# Patient Record
Sex: Male | Born: 1995 | Race: Black or African American | Hispanic: No | Marital: Single | Smoking: Current every day smoker
Health system: Southern US, Community
[De-identification: ages and names within clinical notes are randomized; demographics above are authoritative.]

---

## 2016-09-19 ENCOUNTER — Emergency Department
Admission: EM | Admit: 2016-09-19 | Discharge: 2016-09-19 | Disposition: A | Discharge status: Home / Self Care | Payer: BC Managed Care – PPO | Attending: Student | Admitting: Student

## 2016-09-19 ENCOUNTER — Emergency Department: Payer: BC Managed Care – PPO

## 2016-09-19 ENCOUNTER — Encounter: Payer: Self-pay | Admitting: Emergency Medicine

## 2016-09-19 DIAGNOSIS — F17200 Nicotine dependence, unspecified, uncomplicated: Secondary | ICD-10-CM | POA: Insufficient documentation

## 2016-09-19 DIAGNOSIS — T148XXA Other injury of unspecified body region, initial encounter: Secondary | ICD-10-CM

## 2016-09-19 DIAGNOSIS — S92022A Displaced fracture of anterior process of left calcaneus, initial encounter for closed fracture: Secondary | ICD-10-CM | POA: Diagnosis not present

## 2016-09-19 DIAGNOSIS — W108XXA Fall (on) (from) other stairs and steps, initial encounter: Secondary | ICD-10-CM | POA: Insufficient documentation

## 2016-09-19 DIAGNOSIS — Y939 Activity, unspecified: Secondary | ICD-10-CM | POA: Insufficient documentation

## 2016-09-19 DIAGNOSIS — Y9289 Other specified places as the place of occurrence of the external cause: Secondary | ICD-10-CM | POA: Diagnosis not present

## 2016-09-19 DIAGNOSIS — M79672 Pain in left foot: Secondary | ICD-10-CM | POA: Diagnosis present

## 2016-09-19 DIAGNOSIS — S92002A Unspecified fracture of left calcaneus, initial encounter for closed fracture: Secondary | ICD-10-CM

## 2016-09-19 DIAGNOSIS — Y999 Unspecified external cause status: Secondary | ICD-10-CM | POA: Insufficient documentation

## 2016-09-19 MED ORDER — OXYCODONE-ACETAMINOPHEN 5-325 MG PO TABS: 1.0000 | ORAL_TABLET | ORAL | 0 refills | Status: DC | PRN

## 2016-09-19 MED ORDER — OXYCODONE-ACETAMINOPHEN 5-325 MG PO TABS
1.0000 | ORAL_TABLET | Freq: Once | ORAL | Status: AC
  Administered 2016-09-19: 1 via ORAL
  Filled 2016-09-19: qty 1

## 2016-09-19 MED ORDER — HYDROMORPHONE HCL 1 MG/ML IJ SOLN
0.5000 mg | Freq: Once | INTRAMUSCULAR | Status: AC
  Administered 2016-09-19: 0.5 mg via INTRAMUSCULAR
  Filled 2016-09-19: qty 1

## 2016-09-19 NOTE — ED Triage Notes (Signed)
Pt presents to ED c/o L foot pain secondary to fall last night around 2230. Pt states his foot went through a step and caught on ledge causing fall. Swelling noted to top of foot. Pulses present.

## 2016-09-19 NOTE — ED Notes (Signed)
See triage note  States he caught left foot on step and bent foot back  Positive swelling noted across top of foot   Unable to bear full wt

## 2016-09-19 NOTE — ED Provider Notes (Signed)
[facility] Emergency Department Provider Note   ____________________________________________   None    (approximate)  I have reviewed the triage vital signs and the nursing notes.   HISTORY  Chief Complaint Foot Pain    HPI Jeffery Bradley is a 20 y.o. male since for evaluation of left foot pain. Patient states he fell down the steps last night twisting his foot in the process. No relief with over-the-counter medication. Complaining of increased swollen painful   History reviewed. No pertinent past medical history.  There are no active problems to display for this patient.   History reviewed. No pertinent surgical history.  Prior to Admission medications   Medication Sig Start Date End Date Taking? Authorizing Provider  oxyCODONE-acetaminophen (ROXICET) 5-325 MG tablet Take 1-2 tablets by mouth every 4 (four) hours as needed for severe pain. 09/19/16   Evangeline Dakinharles M Beers, PA-C    Allergies Penicillins  History reviewed. No pertinent family history.  Social History Social History  Substance Use Topics  . Smoking status: Current Every Day Smoker  . Smokeless tobacco: Never Used  . Alcohol use Yes     Comment: couple drinks on weekend    Review of Systems Constitutional: No fever/chills Eyes: No visual changes. ENT: No sore throat. Cardiovascular: Denies chest pain. Respiratory: Denies shortness of breath. Musculoskeletal: Positive for left foot pain. Skin: Negative for rash. Neurological: Negative for headaches, focal weakness or numbness.  10-point ROS otherwise negative.  ____________________________________________   PHYSICAL EXAM:  VITAL SIGNS: ED Triage Vitals  Enc Vitals Group     BP 09/19/16 1158 (!) 149/80     Pulse Rate 09/19/16 1158 (!) 105     Resp 09/19/16 1158 20     Temp 09/19/16 1158 99 F (37.2 C)     Temp Source 09/19/16 1158 Oral     SpO2 09/19/16 1158 98 %     Weight 09/19/16 1159 225 lb (102.1 kg)   Height 09/19/16 1159 5\' 11"  (1.803 m)     Head Circumference --      Peak Flow --      Pain Score 09/19/16 1159 7     Pain Loc --      Pain Edu? --      Excl. in GC? --     Constitutional: Alert and oriented. Well appearing and in no acute distress. Cardiovascular: Normal rate, regular rhythm. Grossly normal heart sounds.  Good peripheral circulation. Respiratory: Normal respiratory effort.  No retractions. Lungs CTAB. Musculoskeletal: Left foot with obvious edema. Good capillary refill. Distally neurovascularly intact. Point tenderness noted throughout the foot. Neurologic:  Normal speech and language. No gross focal neurologic deficits are appreciated. No gait instability. Skin:  Skin is warm, dry and intact. No rash noted. Psychiatric: Mood and affect are normal. Speech and behavior are normal.  ____________________________________________   LABS (all labs ordered are listed, but only abnormal results are displayed)  Labs Reviewed - No data to display ____________________________________________  EKG   ____________________________________________  RADIOLOGY  IMPRESSION:  Probable nondisplaced or slightly displaced fracture within the  anterior calcaneus. If this corresponds to the area of patient's  symptoms, would consider confirmation with CT.   IMPRESSION:  1. CT confirms the presence of multiple small avulsion fractures  involving the anterior aspect of calcaneus and the dorsal aspects of  the talar head and navicular.  2. The subtalar and tibiotalar joints appear unremarkable. No  significant joint effusion.  3. Subcutaneous edema without evidence of tendon  injury.    ____________________________________________   PROCEDURES  Procedure(s) performed: None  Procedures  Critical Care performed: No  ____________________________________________   INITIAL IMPRESSION / ASSESSMENT AND PLAN / ED COURSE  Pertinent labs & imaging results that were available  during my care of the patient were reviewed by me and considered in my medical decision making (see chart for details).  Acute calcaneal fracture. Patient follow-up with podiatry next week. Patient placed in an shortl splint with crutches. Rx given for Percocet 5/325.  Clinical Course     ____________________________________________   FINAL CLINICAL IMPRESSION(S) / ED DIAGNOSES  Final diagnoses:  Calcaneal fracture, left, closed, initial encounter      NEW MEDICATIONS STARTED DURING THIS VISIT:  New Prescriptions   OXYCODONE-ACETAMINOPHEN (ROXICET) 5-325 MG TABLET    Take 1-2 tablets by mouth every 4 (four) hours as needed for severe pain.     Note:  This document was prepared using Dragon voice recognition software and may include unintentional dictation errors.   Evangeline Dakin, PA-C 09/19/16 1449    Gayla Doss, MD 09/19/16 [phone]

## 2016-09-19 NOTE — ED Notes (Signed)
Pt verbalized understanding of discharge instructions. NAD at this time. 

## 2016-10-26 ENCOUNTER — Emergency Department
Admission: EM | Admit: 2016-10-26 | Discharge: 2016-10-26 | Disposition: A | Discharge status: Home / Self Care | Payer: BC Managed Care – PPO | Attending: Emergency Medicine | Admitting: Emergency Medicine

## 2016-10-26 ENCOUNTER — Encounter: Payer: Self-pay | Admitting: Emergency Medicine

## 2016-10-26 DIAGNOSIS — F1290 Cannabis use, unspecified, uncomplicated: Secondary | ICD-10-CM | POA: Insufficient documentation

## 2016-10-26 DIAGNOSIS — F17200 Nicotine dependence, unspecified, uncomplicated: Secondary | ICD-10-CM | POA: Diagnosis not present

## 2016-10-26 DIAGNOSIS — J039 Acute tonsillitis, unspecified: Secondary | ICD-10-CM | POA: Diagnosis not present

## 2016-10-26 DIAGNOSIS — J029 Acute pharyngitis, unspecified: Secondary | ICD-10-CM | POA: Diagnosis present

## 2016-10-26 DIAGNOSIS — Z79899 Other long term (current) drug therapy: Secondary | ICD-10-CM | POA: Insufficient documentation

## 2016-10-26 LAB — POCT RAPID STREP A: Streptococcus, Group A Screen (Direct): NEGATIVE

## 2016-10-26 MED ORDER — MAGIC MOUTHWASH: 5.0000 mL | Freq: Three times a day (TID) | ORAL | 0 refills | Status: DC | PRN

## 2016-10-26 MED ORDER — CLARITHROMYCIN 500 MG PO TABS: 500.0000 mg | ORAL_TABLET | Freq: Two times a day (BID) | ORAL | 0 refills | Status: DC

## 2016-10-26 MED ORDER — CLARITHROMYCIN 500 MG PO TABS
500.0000 mg | ORAL_TABLET | Freq: Once | ORAL | Status: AC
  Administered 2016-10-26: 500 mg via ORAL
  Filled 2016-10-26: qty 1

## 2016-10-26 MED ORDER — MAGIC MOUTHWASH
10.0000 mL | Freq: Once | ORAL | Status: AC
  Administered 2016-10-26: 10 mL via ORAL
  Filled 2016-10-26: qty 10

## 2016-10-26 NOTE — ED Triage Notes (Signed)
Pt states that he has had a sore throat since Saturday but was woken up in pain this AM crying. Pt states that his roommate has some kind of illness with N/V. Pt denies N/V/D at this time. Pt is ambulatory with NAD at this time.

## 2016-10-26 NOTE — ED Provider Notes (Signed)
[facility] Emergency Department Provider Note   ____________________________________________   First MD Initiated Contact with Patient 10/26/16 [phone]     (approximate)  I have reviewed the triage vital signs and the nursing notes.   HISTORY  Chief Complaint Sore Throat    HPI Nat Mathathan Choi is a 20 y.o. male who presents to the ED from home with a chief complain of sore throat. Patient reports onset of sore throat 2 days ago which has worsened over the weekend. Denies associated fever, chills, cough, congestion, abdominal pain, nausea, vomiting, rash. Denies recent travel trauma. Nothing makes his symptoms better or worse.  Past medical history None  There are no active problems to display for this patient.   History reviewed. No pertinent surgical history.  Prior to Admission medications   Medication Sig Start Date End Date Taking? Authorizing Provider  clarithromycin (BIAXIN) 500 MG tablet Take 1 tablet (500 mg total) by mouth 2 (two) times daily. 10/26/16   Irean HongJade J Sung, MD  magic mouthwash SOLN Take 5 mLs by mouth 3 (three) times daily as needed for mouth pain. 10/26/16   Irean HongJade J Sung, MD  oxyCODONE-acetaminophen (ROXICET) 5-325 MG tablet Take 1-2 tablets by mouth every 4 (four) hours as needed for severe pain. 09/19/16   Evangeline Dakinharles M Beers, PA-C    Allergies Penicillins  No family history on file.  Social History Social History  Substance Use Topics  . Smoking status: Current Every Day Smoker    Packs/day: 0.25  . Smokeless tobacco: Never Used  . Alcohol use Yes     Comment: couple drinks on weekend    Review of Systems  Constitutional: No fever/chills. Eyes: No visual changes. ENT: Positive for sore throat. Cardiovascular: Denies chest pain. Respiratory: Denies shortness of breath. Gastrointestinal: No abdominal pain.  No nausea, no vomiting.  No diarrhea.  No constipation. Genitourinary: Negative for dysuria. Musculoskeletal:  Negative for back pain. Skin: Negative for rash. Neurological: Negative for headaches, focal weakness or numbness.  10-point ROS otherwise negative.  ____________________________________________   PHYSICAL EXAM:  VITAL SIGNS: ED Triage Vitals  Enc Vitals Group     BP 10/26/16 0528 (!) 144/85     Pulse Rate 10/26/16 0528 82     Resp 10/26/16 0528 18     Temp 10/26/16 0528 98.1 F (36.7 C)     Temp Source 10/26/16 0528 Oral     SpO2 10/26/16 0528 98 %     Weight 10/26/16 0529 235 lb (106.6 kg)     Height 10/26/16 0529 5\' 11"  (1.803 m)     Head Circumference --      Peak Flow --      Pain Score 10/26/16 0529 5     Pain Loc --      Pain Edu? --      Excl. in GC? --     Constitutional: Alert and oriented. Well appearing and in no acute distress. Eyes: Conjunctivae are normal. PERRL. EOMI. Head: Atraumatic. Nose: No congestion/rhinnorhea. Mouth/Throat: Mucous membranes are moist.  Oropharynx moderately erythematous with tonsillar exudates. No tonsillar swelling or peritonsillar abscess. There is no hoarse or muffled voice. There is no drooling. Neck: No stridor.  Supple neck without meningismus. Hematological/Lymphatic/Immunilogical: No cervical lymphadenopathy. Cardiovascular: Normal rate, regular rhythm. Grossly normal heart sounds.  Good peripheral circulation. Respiratory: Normal respiratory effort.  No retractions. Lungs CTAB. Gastrointestinal: Soft and nontender. No distention. No abdominal bruits. No CVA tenderness. Musculoskeletal: No lower extremity tenderness nor edema.  No joint effusions. Neurologic:  Normal speech and language. No gross focal neurologic deficits are appreciated. No gait instability. Skin:  Skin is warm, dry and intact. No rash noted. Psychiatric: Mood and affect are normal. Speech and behavior are normal.  ____________________________________________   LABS (all labs ordered are listed, but only abnormal results are displayed)  Labs Reviewed    POCT RAPID STREP A   ____________________________________________  EKG  none ____________________________________________  RADIOLOGY  none ____________________________________________   PROCEDURES  Procedure(s) performed: None  Procedures  Critical Care performed: No  ____________________________________________   INITIAL IMPRESSION / ASSESSMENT AND PLAN / ED COURSE  Pertinent labs & imaging results that were available during my care of the patient were reviewed by me and considered in my medical decision making (see chart for details).  20 year old male presenting with sore throat. Clinical exam consistent with tonsillitis. Will initiate antibiotic treatment with Biaxin, Magic mouthwash and patient will follow-up with his PCP next week. Strict return precautions given. Patient verbalizes understanding and agrees with plan of care.  Clinical Course      ____________________________________________   FINAL CLINICAL IMPRESSION(S) / ED DIAGNOSES  Final diagnoses:  Tonsillitis  Sore throat      NEW MEDICATIONS STARTED DURING THIS VISIT:  New Prescriptions   CLARITHROMYCIN (BIAXIN) 500 MG TABLET    Take 1 tablet (500 mg total) by mouth 2 (two) times daily.   MAGIC MOUTHWASH SOLN    Take 5 mLs by mouth 3 (three) times daily as needed for mouth pain.     Note:  This document was prepared using Dragon voice recognition software and may include unintentional dictation errors.    Irean HongJade J Sung, MD 10/26/16 [phone]

## 2016-10-26 NOTE — Discharge Instructions (Signed)
1. Take antibiotic as prescribed (Biaxin 500 mg twice daily 7 days). 2. You may take Magic mouthwash as needed for throat pain. 3. Return to the ER for worsening symptoms, persistent vomiting, difficulty breathing or other concerns.

## 2016-10-28 ENCOUNTER — Telehealth: Payer: Self-pay | Admitting: Emergency Medicine

## 2016-10-28 NOTE — Telephone Encounter (Signed)
Patient left message asking if his diagnosis was contagious.  I called him back,  Left message with my number.

## 2020-02-05 ENCOUNTER — Emergency Department
Admission: EM | Admit: 2020-02-05 | Discharge: 2020-02-05 | Disposition: A | Discharge status: Home / Self Care | Payer: Self-pay | Attending: Student | Admitting: Student

## 2020-02-05 ENCOUNTER — Other Ambulatory Visit: Payer: Self-pay

## 2020-02-05 ENCOUNTER — Emergency Department: Payer: Self-pay

## 2020-02-05 ENCOUNTER — Encounter: Payer: Self-pay | Admitting: Emergency Medicine

## 2020-02-05 DIAGNOSIS — F17210 Nicotine dependence, cigarettes, uncomplicated: Secondary | ICD-10-CM | POA: Insufficient documentation

## 2020-02-05 DIAGNOSIS — J0391 Acute recurrent tonsillitis, unspecified: Secondary | ICD-10-CM | POA: Insufficient documentation

## 2020-02-05 LAB — GROUP A STREP BY PCR: Group A Strep by PCR: NOT DETECTED

## 2020-02-05 MED ORDER — METHYLPREDNISOLONE SODIUM SUCC 125 MG IJ SOLR
125.0000 mg | Freq: Once | INTRAMUSCULAR | Status: AC
Start: 2020-02-05 — End: 2020-02-05
  Administered 2020-02-05: 125 mg via INTRAMUSCULAR
  Filled 2020-02-05: qty 2

## 2020-02-05 MED ORDER — ALUM & MAG HYDROXIDE-SIMETH 200-200-20 MG/5ML PO SUSP
30.0000 mL | Freq: Once | ORAL | Status: AC
  Administered 2020-02-05: 30 mL via ORAL
  Filled 2020-02-05: qty 30

## 2020-02-05 MED ORDER — LIDOCAINE VISCOUS HCL 2 % MT SOLN
15.0000 mL | Freq: Once | OROMUCOSAL | Status: AC
  Administered 2020-02-05: 15 mL via OROMUCOSAL
  Filled 2020-02-05: qty 15

## 2020-02-05 MED ORDER — METHYLPREDNISOLONE 4 MG PO TBPK: ORAL_TABLET | ORAL | 0 refills | Status: AC

## 2020-02-05 MED ORDER — DIPHENHYDRAMINE HCL 12.5 MG/5ML PO ELIX
12.5000 mg | ORAL_SOLUTION | Freq: Once | ORAL | Status: AC
  Administered 2020-02-05: 12.5 mg via ORAL
  Filled 2020-02-05: qty 5

## 2020-02-05 MED ORDER — LIDOCAINE VISCOUS HCL 2 % MT SOLN: 5.0000 mL | Freq: Four times a day (QID) | OROMUCOSAL | 0 refills | Status: AC | PRN

## 2020-02-05 MED ORDER — LIDOCAINE VISCOUS HCL 2 % MT SOLN
15.0000 mL | Freq: Once | OROMUCOSAL | Status: AC
  Administered 2020-02-05: 15 mL via ORAL
  Filled 2020-02-05: qty 15

## 2020-02-05 NOTE — Discharge Instructions (Signed)
Follow discharge care instruction.  Contact the ENT clinic to schedule appointment for evaluation of tonsillectomy.

## 2020-02-05 NOTE — ED Provider Notes (Signed)
[facility] Emergency Department Provider Note   ____________________________________________   First MD Initiated Contact with Patient 02/05/20 1235     (approximate)  I have reviewed the triage vital signs and the nursing notes.   HISTORY  Chief Complaint Sore Throat    HPI Jeffery Bradley is a 24 y.o. male patient complain sore throat for 5 days.  Patient has a history of tonsil-itis.  Patient is a hard to swallow Intal.  Patient states transient relief with Chloraseptic spray.  Patient able to tolerate fluids but cannot tolerate solid foods at this time.  Patient rates pain as a 10/10.  Patient described pain is "sore".         History reviewed. No pertinent past medical history.  There are no problems to display for this patient.   History reviewed. No pertinent surgical history.  Prior to Admission medications   Medication Sig Start Date End Date Taking? Authorizing Provider  lidocaine (XYLOCAINE) 2 % solution Use as directed 5 mLs in the mouth or throat every 6 (six) hours as needed for mouth pain. 02/05/20   Sable Feil, PA-C  methylPREDNISolone (MEDROL DOSEPAK) 4 MG TBPK tablet Take Tapered dose as directed 02/05/20   Sable Feil, PA-C    Allergies Penicillins  No family history on file.  Social History Social History   Tobacco Use  . Smoking status: Current Every Day Smoker    Packs/day: 0.25  . Smokeless tobacco: Never Used  Substance Use Topics  . Alcohol use: Yes    Comment: couple drinks on weekend  . Drug use: Yes    Types: Marijuana    Review of Systems Constitutional: No fever/chills Eyes: No visual changes. ENT: Sore throat. Cardiovascular: Denies chest pain. Respiratory: Denies shortness of breath. Gastrointestinal: No abdominal pain.  No nausea, no vomiting.  No diarrhea.  No constipation. Genitourinary: Negative for dysuria. Musculoskeletal: Negative for back pain. Skin: Negative for  rash. Neurological: Negative for headaches, focal weakness or numbness.   ____________________________________________   PHYSICAL EXAM:  VITAL SIGNS: ED Triage Vitals  Enc Vitals Group     BP 02/05/20 1211 (!) 147/93     Pulse Rate 02/05/20 1211 (!) 103     Resp 02/05/20 1211 20     Temp 02/05/20 1211 99.6 F (37.6 C)     Temp Source 02/05/20 1211 Oral     SpO2 02/05/20 1211 96 %     Weight 02/05/20 1214 260 lb (117.9 kg)     Height 02/05/20 1214 5\' 11"  (1.803 m)     Head Circumference --      Peak Flow --      Pain Score 02/05/20 1212 10     Pain Loc --      Pain Edu? --      Excl. in [place]? --    Constitutional: Alert and oriented. Well appearing and in no acute distress. Mouth/Throat: Mucous membranes are moist.  Oropharynx non-erythematous.  Edematous nonexudative tonsils. Neck: No stridor.  Hematological/Lymphatic/Immunilogical: No cervical lymphadenopathy. Cardiovascular: Normal rate, regular rhythm. Grossly normal heart sounds.  Good peripheral circulation. Respiratory: Normal respiratory effort.  No retractions. Lungs CTAB. Neurologic:  Normal speech and language. No gross focal neurologic deficits are appreciated. No gait instability. Skin:  Skin is warm, dry and intact. No rash noted. Psychiatric: Mood and affect are normal. Speech and behavior are normal.  ____________________________________________   LABS (all labs ordered are listed, but only abnormal results are displayed)  Labs  Reviewed  GROUP A STREP BY PCR   ____________________________________________  EKG   ____________________________________________  RADIOLOGY  ED MD interpretation:    Official radiology report(s): DG Neck Soft Tissue  Result Date: 02/05/2020 CLINICAL DATA:  Throat pain and dysphagia EXAM: NECK SOFT TISSUES - 1+ VIEW COMPARISON:  None. FINDINGS: Frontal and lateral views were obtained. Epiglottis and aryepiglottic folds appear normal. Prevertebral soft tissues are normal.  No air-fluid level to suggest abscess. Soft tissue prominence is present in the lingular tonsil region. Tonsils and adenoids elsewhere appear normal. No bony abnormality. Visualized lungs clear. No tracheal narrowing noted. IMPRESSION: Lingular tonsillar prominence of uncertain etiology. Inflammation could cause this appearance. No other tonsillar enlargement. Epiglottis and aryepiglottic folds appear normal. Study otherwise unremarkable. Electronically Signed   By: Bretta Bang III M.D.   On: 02/05/2020 13:34    ____________________________________________   PROCEDURES  Procedure(s) performed (including Critical Care):  Procedures   ____________________________________________   INITIAL IMPRESSION / ASSESSMENT AND PLAN / ED COURSE  As part of my medical decision making, I reviewed the following data within the electronic MEDICAL RECORD NUMBER     Patient presents with 5 days of sore throat.  Discussed negative strep results with patient.  Discussed soft tissue neck findings with patient.  His exam is consistent with tonsillitis.  Patient given discharge care instruction and supportive care medication.  Patient advised to follow-up with ENT secondary to recurrent tonsillitis.   Jeffery Bradley was evaluated in Emergency Department on 02/05/2020 for the symptoms described in the history of present illness. He was evaluated in the context of the global COVID-19 pandemic, which necessitated consideration that the patient might be at risk for infection with the SARS-CoV-2 virus that causes COVID-19. Institutional protocols and algorithms that pertain to the evaluation of patients at risk for COVID-19 are in a state of rapid change based on information released by regulatory bodies including the CDC and federal and state organizations. These policies and algorithms were followed during the patient's care in the ED.       ____________________________________________   FINAL CLINICAL  IMPRESSION(S) / ED DIAGNOSES  Final diagnoses:  Recurrent tonsillitis     ED Discharge Orders         Ordered    lidocaine (XYLOCAINE) 2 % solution  Every 6 hours PRN     02/05/20 1556    methylPREDNISolone (MEDROL DOSEPAK) 4 MG TBPK tablet     02/05/20 1556           Note:  This document was prepared using Dragon voice recognition software and may include unintentional dictation errors.    Joni Reining, PA-C 02/05/20 1601    Miguel Aschoff., MD 02/05/20 [phone]

## 2020-02-05 NOTE — ED Notes (Signed)
See triage note  Presents with sore throat  States he developed sore throat last Thursday  Low grade fever on  Arrival  Increased pain with swallowing

## 2020-02-05 NOTE — ED Triage Notes (Signed)
Pt presents to ED via POV with c/o sore throat since Thursday night. P tstates hx of Tonsillitis. Pt endorses feeling like hard to talk due to pain. Pt typing on phone for communication. Pt appears reasonably oriented with answers via text message, pt does not speak while in triage. VSS.   Pt states using hall and chloraseptic spray without relief.   Pt appears able to maintain his own secretions in triage. Pt with noted inflammation to tonsils in triage, no white patches visible to this RN, no significant redness noted in triage.

## 2020-04-05 ENCOUNTER — Ambulatory Visit: Payer: Self-pay | Attending: Internal Medicine

## 2020-04-05 DIAGNOSIS — Z23 Encounter for immunization: Secondary | ICD-10-CM

## 2020-04-05 NOTE — Progress Notes (Signed)
   Covid-19 Vaccination Clinic  Name:  Jeffery Bradley    MRN: [id number] DOB: 04/09/1996  04/05/2020  [name] was observed post Covid-19 immunization for 15 minutes without incident. He was provided with Vaccine Information Sheet and instruction to access the V-Safe system.   [name] was instructed to call 911 with any severe reactions post vaccine: Marland Kitchen Difficulty breathing  . Swelling of face and throat  . A fast heartbeat  . A bad rash all over body  . Dizziness and weakness   Immunizations Administered    Name Date Dose VIS Date Route   Pfizer COVID-19 Vaccine 04/05/2020  4:21 PM 0.3 mL 12/01/2019 Intramuscular   Manufacturer: [employer], [employer]   Lot: [id number]   NDC: [address]-0

## 2020-04-29 ENCOUNTER — Ambulatory Visit: Payer: Self-pay | Attending: Internal Medicine

## 2020-04-29 DIAGNOSIS — Z23 Encounter for immunization: Secondary | ICD-10-CM

## 2020-04-29 NOTE — Progress Notes (Signed)
   Covid-19 Vaccination Clinic  Name:  Jeffery Bradley    MRN: [id number] DOB: 02/07/1996  04/29/2020  Mr. Jeffery Bradley was observed post Covid-19 immunization for 15 minutes without incident. He was provided with Vaccine Information Sheet and instruction to access the V-Safe system.   Mr. Jeffery Bradley was instructed to call 911 with any severe reactions post vaccine: Marland Kitchen Difficulty breathing  . Swelling of face and throat  . A fast heartbeat  . A bad rash all over body  . Dizziness and weakness   Immunizations Administered    Name Date Dose VIS Date Route   Pfizer COVID-19 Vaccine 04/29/2020  9:43 AM 0.3 mL 02/14/2019 Intramuscular   Manufacturer: [employer], [employer]   Lot: [id number]   NDC: [address]-8

## 2020-09-05 ENCOUNTER — Ambulatory Visit: Payer: Self-pay | Admitting: Physician Assistant

## 2020-09-05 ENCOUNTER — Other Ambulatory Visit: Payer: Self-pay

## 2020-09-05 ENCOUNTER — Encounter: Payer: Self-pay | Admitting: Physician Assistant

## 2020-09-05 DIAGNOSIS — Z113 Encounter for screening for infections with a predominantly sexual mode of transmission: Secondary | ICD-10-CM

## 2020-09-05 NOTE — Progress Notes (Signed)
   [facility] Department STI clinic/screening visit  Subjective:  Talin Feister is a 24 y.o. male being seen today for an STI screening visit. The patient reports they do not have symptoms.    Patient has the following medical conditions:  There are no problems to display for this patient.    Chief Complaint  Patient presents with  . SEXUALLY TRANSMITTED DISEASE    screening    HPI  Patient reports that she is not having any symptoms but would like a screening today.  States that she needs additional testing for PCP visit at [facility] but is not sure that we can do this here.  Reports last HIV test was 03/2020.  Last void prior to sample collection for GC/Chlamydia was 1.5 hr ago.   See flowsheet for further details and programmatic requirements.    The following portions of the patient's history were reviewed and updated as appropriate: allergies, current medications, past medical history, past social history, past surgical history and problem list.  Objective:  There were no vitals filed for this visit.  Physical Exam Constitutional:      General: He is not in acute distress.    Appearance: Normal appearance.  HENT:     Head: Normocephalic and atraumatic.     Comments: No nits, lice or hair loss. No cervical, supraclavicular or axillary adenopathy.    Mouth/Throat:     Mouth: Mucous membranes are moist.     Pharynx: Oropharynx is clear. No oropharyngeal exudate or posterior oropharyngeal erythema.  Eyes:     Conjunctiva/sclera: Conjunctivae normal.  Pulmonary:     Effort: Pulmonary effort is normal.  Abdominal:     Palpations: Abdomen is soft. There is no mass.     Tenderness: There is no abdominal tenderness. There is no guarding or rebound.  Genitourinary:    Penis: Normal.      Testes: Normal.     Rectum: Normal.     Comments: Pubic area without nits, lice, edema, erythema, lesions and inguinal adenopathy. Penis without rash, lesions and discharge at  meatus. Musculoskeletal:     Cervical back: Neck supple. No tenderness.  Skin:    General: Skin is warm and dry.     Findings: No bruising, erythema, lesion or rash.  Neurological:     Mental Status: He is alert and oriented to person, place, and time.  Psychiatric:        Mood and Affect: Mood normal.        Behavior: Behavior normal.        Thought Content: Thought content normal.        Judgment: Judgment normal.       Assessment and Plan:  Star Cheese is a 24 y.o. male presenting to the [facility] Department for STI screening  1. Screening for STD (sexually transmitted disease) Patient into clinic without symptoms. Rec condoms with all sex. Await test results.  Counseled that RN will call if needs to RTC for treatment once results are back. - Chlamydia/Gonorrhea [place] [facility] - HIV/HCV [place] [facility] - Syphilis Serology, [place] [facility] - Chlamydia/Gonorrhea [place] [facility] - Chlamydia/Gonorrhea [place] [facility]     No follow-ups on file.  No future appointments.  Matt Holmes, PA

## 2020-09-12 LAB — HM HEPATITIS C SCREENING LAB: HM Hepatitis Screen: NEGATIVE

## 2020-09-12 LAB — HM HIV SCREENING LAB: HM HIV Screening: NEGATIVE

## 2020-10-19 ENCOUNTER — Encounter: Payer: Self-pay | Admitting: Emergency Medicine

## 2020-10-19 ENCOUNTER — Other Ambulatory Visit: Payer: Self-pay

## 2020-10-19 DIAGNOSIS — R112 Nausea with vomiting, unspecified: Secondary | ICD-10-CM | POA: Insufficient documentation

## 2020-10-19 DIAGNOSIS — F17210 Nicotine dependence, cigarettes, uncomplicated: Secondary | ICD-10-CM | POA: Insufficient documentation

## 2020-10-19 MED ORDER — ONDANSETRON 4 MG PO TBDP
4.0000 mg | ORAL_TABLET | Freq: Once | ORAL | Status: AC | PRN
  Administered 2020-10-19: 4 mg via ORAL
  Filled 2020-10-19: qty 1

## 2020-10-19 NOTE — ED Triage Notes (Signed)
Pt arrived via POV with c/o chills, fever, nausea and emesis. Pt is able to drink water, c/o poor PO intake as well.   Sxs started yesterday.

## 2020-10-20 ENCOUNTER — Emergency Department
Admission: EM | Admit: 2020-10-20 | Discharge: 2020-10-20 | Disposition: A | Discharge status: Home / Self Care | Payer: Self-pay | Attending: Emergency Medicine | Admitting: Emergency Medicine

## 2020-10-20 DIAGNOSIS — R112 Nausea with vomiting, unspecified: Secondary | ICD-10-CM

## 2020-10-20 LAB — COMPREHENSIVE METABOLIC PANEL
ALT: 22 U/L (ref 0–44)
AST: 29 U/L (ref 15–41)
Albumin: 4.2 g/dL (ref 3.5–5.0)
Alkaline Phosphatase: 64 U/L (ref 38–126)
Anion gap: 13 (ref 5–15)
BUN: 14 mg/dL (ref 6–20)
CO2: 22 mmol/L (ref 22–32)
Calcium: 8.7 mg/dL — ABNORMAL LOW (ref 8.9–10.3)
Chloride: 104 mmol/L (ref 98–111)
Creatinine, Ser: 1.12 mg/dL (ref 0.61–1.24)
GFR, Estimated: >60 mL/min (ref >60)
Glucose, Bld: 127 mg/dL — ABNORMAL HIGH (ref 70–99)
Potassium: 3.6 mmol/L (ref 3.5–5.1)
Sodium: 139 mmol/L (ref 135–145)
Total Bilirubin: 1.3 mg/dL — ABNORMAL HIGH (ref 0.3–1.2)
Total Protein: 7.3 g/dL (ref 6.5–8.1)

## 2020-10-20 LAB — URINALYSIS, COMPLETE (UACMP) WITH MICROSCOPIC
Bilirubin Urine: NEGATIVE
Glucose, UA: NEGATIVE mg/dL
Hgb urine dipstick: NEGATIVE
Ketones, ur: 5 mg/dL — AB
Nitrite: NEGATIVE
Protein, ur: NEGATIVE mg/dL
Specific Gravity, Urine: 1.024 (ref 1.005–1.030)
pH: 8 (ref 5.0–8.0)

## 2020-10-20 LAB — CBC
HCT: 43.1 % (ref 39.0–52.0)
Hemoglobin: 15 g/dL (ref 13.0–17.0)
MCH: 33.5 pg (ref 26.0–34.0)
MCHC: 34.8 g/dL (ref 30.0–36.0)
MCV: 96.2 fL (ref 80.0–100.0)
Platelets: 323 10*3/uL (ref 150–400)
RBC: 4.48 MIL/uL (ref 4.22–5.81)
RDW: 12.7 % (ref 11.5–15.5)
WBC: 8.9 10*3/uL (ref 4.0–10.5)
nRBC: 0 % (ref 0.0–0.2)

## 2020-10-20 LAB — LIPASE, BLOOD: Lipase: 42 U/L (ref 11–51)

## 2020-10-20 MED ORDER — ONDANSETRON 4 MG PO TBDP: 4.0000 mg | ORAL_TABLET | Freq: Three times a day (TID) | ORAL | 0 refills | Status: AC | PRN

## 2020-10-20 MED ORDER — CALCIUM CARBONATE ANTACID 500 MG PO CHEW
1.0000 | CHEWABLE_TABLET | Freq: Once | ORAL | Status: AC
  Administered 2020-10-20: 200 mg via ORAL
  Filled 2020-10-20: qty 1

## 2020-10-20 NOTE — ED Notes (Signed)
Pt states vomiting several times and is green in color. Pt is resting in bed. Pulse ox and bp cuff on pt.

## 2020-10-20 NOTE — ED Notes (Signed)
Pt given a warm blanket 

## 2020-10-20 NOTE — ED Provider Notes (Signed)
[facility] Emergency Department Provider Note   ____________________________________________   First MD Initiated Contact with Patient 10/20/20 [phone]     (approximate)  I have reviewed the triage vital signs and the nursing notes.   HISTORY  Chief Complaint Emesis and Fever    HPI Niv Darley is a 24 y.o. male with no stated past medical history who presents for 24 hours of nausea and vomiting.  Patient denies any abdominal pain.  Patient states that he is able to keep down some water but is unable to keep down any solids since yesterday.  Patient denies any recent sick contacts.  Patient states that he is fully vaccinated for Covid.  Patient states that symptoms have greatly improved after Zofran was ordered in the waiting room.         History reviewed. No pertinent past medical history.  There are no problems to display for this patient.   History reviewed. No pertinent surgical history.  Prior to Admission medications   Medication Sig Start Date End Date Taking? Authorizing Provider  emtricitabine-tenofovir (TRUVADA) 200-300 MG tablet Take 1 tablet by mouth daily.    [provider]  fexofenadine (ALLEGRA) 60 MG tablet Take by mouth.    [provider]  lidocaine (XYLOCAINE) 2 % solution Use as directed 5 mLs in the mouth or throat every 6 (six) hours as needed for mouth pain. 02/05/20   Joni Reining, PA-C  methylPREDNISolone (MEDROL DOSEPAK) 4 MG TBPK tablet Take Tapered dose as directed 02/05/20   Joni Reining, PA-C  ondansetron (ZOFRAN ODT) 4 MG disintegrating tablet Take 1 tablet (4 mg total) by mouth every 8 (eight) hours as needed for nausea or vomiting. 10/20/20   Merwyn Katos, MD    Allergies Penicillins  No family history on file.  Social History Social History   Tobacco Use  . Smoking status: Current Every Day Smoker    Packs/day: 1.00    Types: Cigarettes  . Smokeless tobacco: Never Used  Vaping  Use  . Vaping Use: Never used  Substance Use Topics  . Alcohol use: Yes    Comment: couple drinks on weekend  . Drug use: Yes    Types: Marijuana    Review of Systems Constitutional: No fever/chills Eyes: No visual changes. ENT: No sore throat. Cardiovascular: Denies chest pain. Respiratory: Denies shortness of breath. Gastrointestinal: No abdominal pain.  Endorses nausea, no vomiting.  No diarrhea. Genitourinary: Negative for dysuria. Musculoskeletal: Negative for acute arthralgias Skin: Negative for rash. Neurological: Negative for headaches, weakness/numbness/paresthesias in any extremity Psychiatric: Negative for suicidal ideation/homicidal ideation   ____________________________________________   PHYSICAL EXAM:  VITAL SIGNS: ED Triage Vitals  Enc Vitals Group     BP 10/19/20 2327 (!) 153/102     Pulse Rate 10/19/20 2327 85     Resp 10/19/20 2327 20     Temp 10/19/20 2327 98.2 F (36.8 C)     Temp Source 10/19/20 2327 Oral     SpO2 10/19/20 2327 97 %     Weight 10/19/20 2325 220 lb (99.8 kg)     Height 10/19/20 2325 5\' 11"  (1.803 m)     Head Circumference --      Peak Flow --      Pain Score 10/19/20 2324 8     Pain Loc --      Pain Edu? --      Excl. in GC? --    Constitutional: Alert and oriented. Well appearing  and in no acute distress. Eyes: Conjunctivae are normal. PERRL. Head: Atraumatic. Nose: No congestion/rhinnorhea. Mouth/Throat: Mucous membranes are moist. Neck: No stridor Cardiovascular: Grossly normal heart sounds.  Good peripheral circulation. Respiratory: Normal respiratory effort.  No retractions. Gastrointestinal: Soft and nontender. No distention. Musculoskeletal: No obvious deformities Neurologic:  Normal speech and language. No gross focal neurologic deficits are appreciated. Skin:  Skin is warm and dry. No rash noted. Psychiatric: Mood and affect are normal. Speech and behavior are  normal.  ____________________________________________   LABS (all labs ordered are listed, but only abnormal results are displayed)  Labs Reviewed  COMPREHENSIVE METABOLIC PANEL - Abnormal; Notable for the following components:      Result Value   Glucose, Bld 127 (*)    Calcium 8.7 (*)    Total Bilirubin 1.3 (*)    All other components within normal limits  URINALYSIS, COMPLETE (UACMP) WITH MICROSCOPIC - Abnormal; Notable for the following components:   Color, Urine YELLOW (*)    APPearance CLEAR (*)    Ketones, ur 5 (*)    Leukocytes,Ua TRACE (*)    Bacteria, UA RARE (*)    All other components within normal limits  LIPASE, BLOOD  CBC     PROCEDURES  Procedure(s) performed (including Critical Care):  .1-3 Lead EKG Interpretation Performed by: Merwyn Katos, MD Authorized by: Merwyn Katos, MD     Interpretation: normal     ECG rate:  89   ECG rate assessment: normal     Rhythm: sinus rhythm     Ectopy: none     Conduction: normal       ____________________________________________   INITIAL IMPRESSION / ASSESSMENT AND PLAN / ED COURSE  As part of my medical decision making, I reviewed the following data within the electronic MEDICAL RECORD NUMBER Nursing notes reviewed and incorporated, Labs reviewed, Old chart reviewed, and Notes from prior ED visits reviewed and incorporated        Patient presents for acute nausea/vomiting The cause of the patients symptoms is not clear, but the patient is overall well appearing and is suspected to have a transient course of illness.  Given History and Exam there does not appear to be an emergent cause of the symptoms such as small bowel obstruction, coronary syndrome, bowel ischemia, DKA, pancreatitis, appendicitis, other acute abdomen or other emergent problem.  Reassessment: After treatment, the patient is feeling much better, tolerating PO fluids, and shows no signs of dehydration.   Disposition: Discharge home  with prompt primary care physician follow up in the next 48 hours. Strict return precautions discussed.      ____________________________________________   FINAL CLINICAL IMPRESSION(S) / ED DIAGNOSES  Final diagnoses:  Non-intractable vomiting with nausea, unspecified vomiting type     ED Discharge Orders         Ordered    ondansetron (ZOFRAN ODT) 4 MG disintegrating tablet  Every 8 hours PRN        10/20/20 0208           Note:  This document was prepared using Dragon voice recognition software and may include unintentional dictation errors.   Merwyn Katos, MD 10/20/20 0400

## 2020-10-20 NOTE — ED Notes (Signed)
Pt given crackers and water for PO challenge

## 2021-06-16 ENCOUNTER — Encounter: Payer: Self-pay | Admitting: Physician Assistant

## 2021-06-16 ENCOUNTER — Other Ambulatory Visit: Payer: Self-pay

## 2021-06-16 ENCOUNTER — Ambulatory Visit: Payer: Self-pay | Admitting: Physician Assistant

## 2021-06-16 DIAGNOSIS — Z113 Encounter for screening for infections with a predominantly sexual mode of transmission: Secondary | ICD-10-CM

## 2021-06-16 NOTE — Progress Notes (Signed)
25 year old male in clinic today for an STI exam. Glenna Fellows, RN

## 2021-06-16 NOTE — Progress Notes (Signed)
[facility] Department STI clinic/screening visit  Subjective:  Jeffery Bradley is a 25 y.o. male being seen today for an STI screening visit. The patient reports they do not have symptoms.    Patient has the following medical conditions:  There are no problems to display for this patient.    Chief Complaint  Patient presents with   SEXUALLY TRANSMITTED DISEASE    screening    HPI  Patient reports that he has had a positive test at [facility] for HIV and is here for a full screening and in particular a HIV test with confirmatory testing.  Denies any symptoms, chronic conditions, surgeries and regular medicines.  Reports that he was previously on PrEP but stopped it a while ago.  Reports last HIV test was earlier this month and last void prior to sample collection for GC/Chlamydia testing was over 2 hr ago.   See flowsheet for further details and programmatic requirements.    The following portions of the patient's history were reviewed and updated as appropriate: allergies, current medications, past medical history, past social history, past surgical history and problem list.  Objective:  There were no vitals filed for this visit.  Physical Exam Constitutional:      General: He is not in acute distress.    Appearance: Normal appearance.  HENT:     Head: Normocephalic and atraumatic.     Comments: No nits,lice, or hair loss. No cervical, supraclavicular or axillary adenopathy.     Mouth/Throat:     Mouth: Mucous membranes are moist.     Pharynx: Oropharynx is clear. No oropharyngeal exudate or posterior oropharyngeal erythema.  Eyes:     Conjunctiva/sclera: Conjunctivae normal.  Pulmonary:     Effort: Pulmonary effort is normal.  Abdominal:     Palpations: Abdomen is soft. There is no mass.     Tenderness: There is no abdominal tenderness. There is no guarding or rebound.  Genitourinary:    Penis: Normal.      Testes: Normal.     Rectum: Normal.      Comments: Pubic area without nits, lice, hair loss, edema, erythema, lesions and inguinal adenopathy. Penis circumcised  without rash, lesions and discharge at meatus. Testicles descended bilaterally,nt, no masses or edema.  Musculoskeletal:     Cervical back: Neck supple. No tenderness.  Skin:    General: Skin is warm and dry.     Findings: No bruising, erythema, lesion or rash.  Neurological:     Mental Status: He is alert and oriented to person, place, and time.  Psychiatric:        Mood and Affect: Mood normal.        Behavior: Behavior normal.        Thought Content: Thought content normal.        Judgment: Judgment normal.      Assessment and Plan:  Jeffery Bradley is a 25 y.o. male presenting to the [facility] Department for STI screening  1. Screening for STD (sexually transmitted disease) Patient into clinic without symptoms. Patient counseled re: [facility] testing for HIV includes 3 types of HIV testing. Counseled that if the testing is positive that he will be interviewed by [facility]. Rec condoms with all sex. Await test results.  Counseled that RN will call if needs to [facility] for treatment once results are back.  - Chlamydia/Gonorrhea [place] [facility] - HBV Antigen/Antibody [facility] - HIV/HCV [place] [facility] - Syphilis Serology, [place] [facility] - Chlamydia/Gonorrhea  [place] [facility] - Chlamydia/Gonorrhea [place] [facility]     No follow-ups on file.  No future appointments.  Matt Holmes, PA

## 2021-06-18 LAB — HEPATITIS B SURFACE ANTIGEN

## 2021-06-18 LAB — HM HEPATITIS C SCREENING LAB: HM Hepatitis Screen: NEGATIVE

## 2021-06-19 LAB — HM HIV SCREENING LAB: HM HIV Screening: POSITIVE

## 2021-07-08 NOTE — Progress Notes (Signed)
Order(s) created erroneously. Erroneous order ID: [id number]  Order moved by: SHEALY, DEBRA D  Order move date/time: 07/08/2021 10:54 AM  Source Patient: [id number]  Source Contact: 06/18/2021  Destination Patient: [id number]  Destination Contact: 08/08/2020 

## 2022-02-11 IMAGING — CR DG NECK SOFT TISSUE
2 series · 2 of 2 positions shown · non-contrast
Comparison: None.

CLINICAL DATA: Throat pain and dysphagia

EXAM:
NECK SOFT TISSUES - 1+ VIEW

[neck lat]
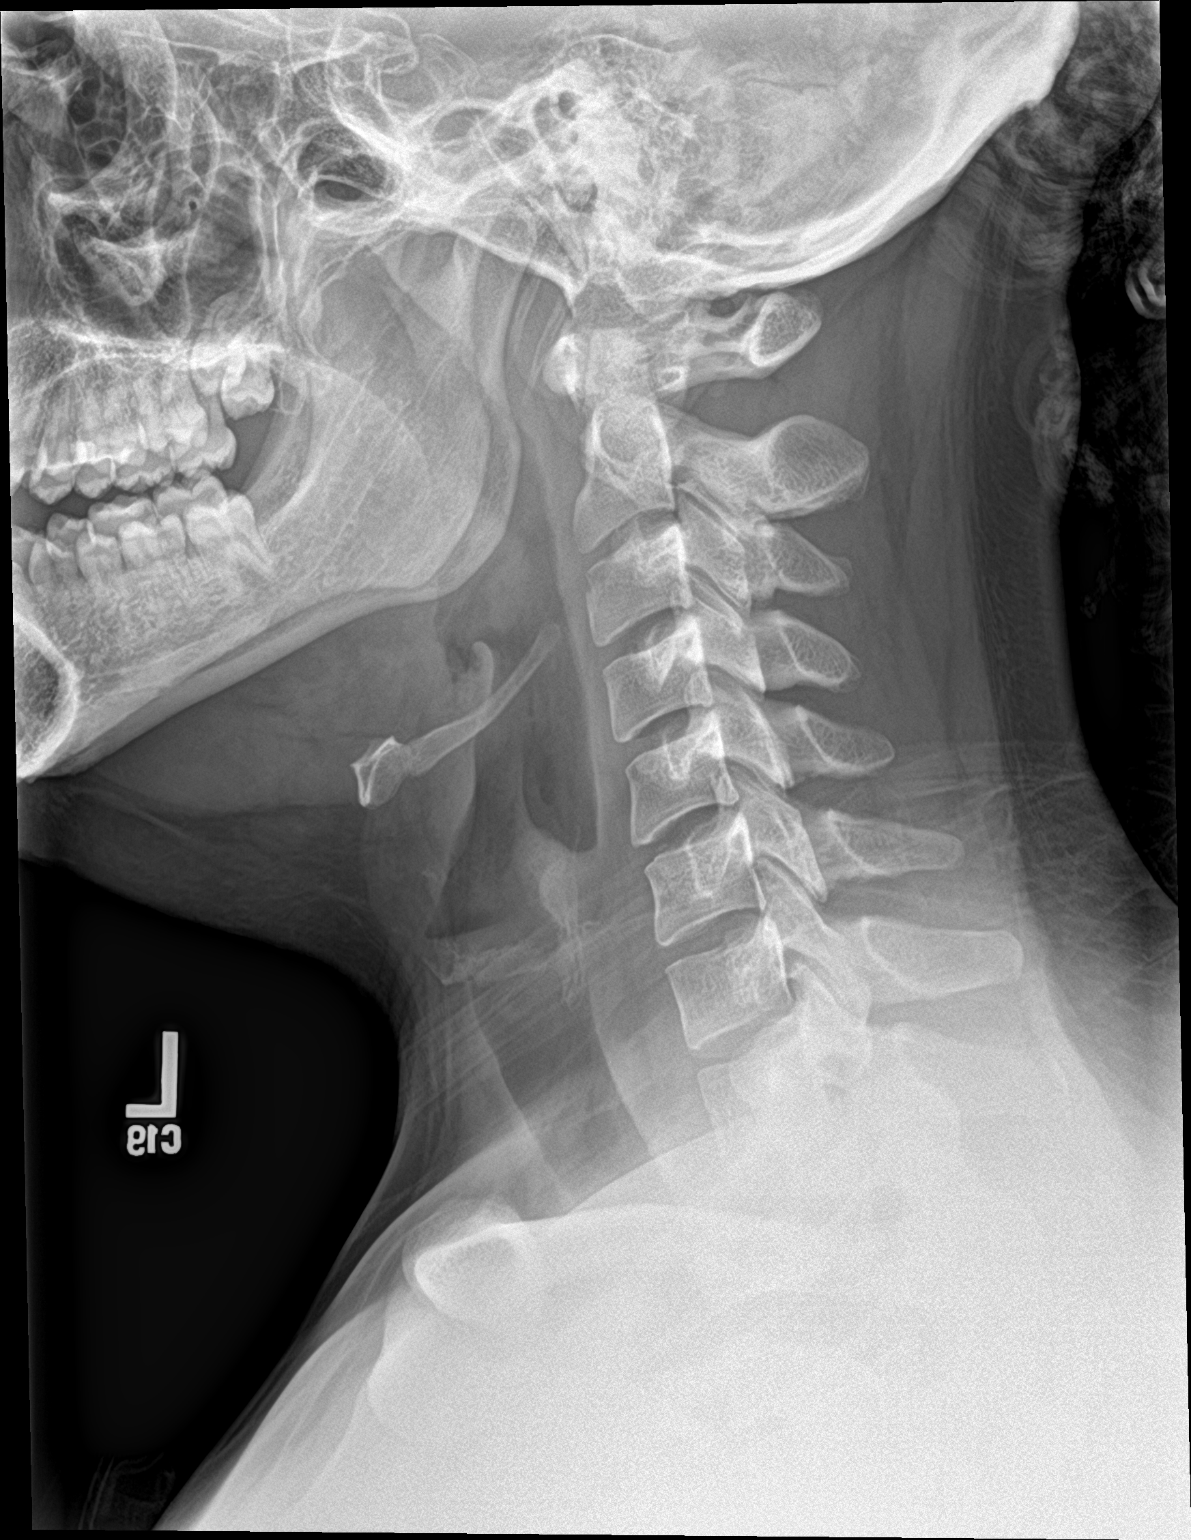

[neck ap]
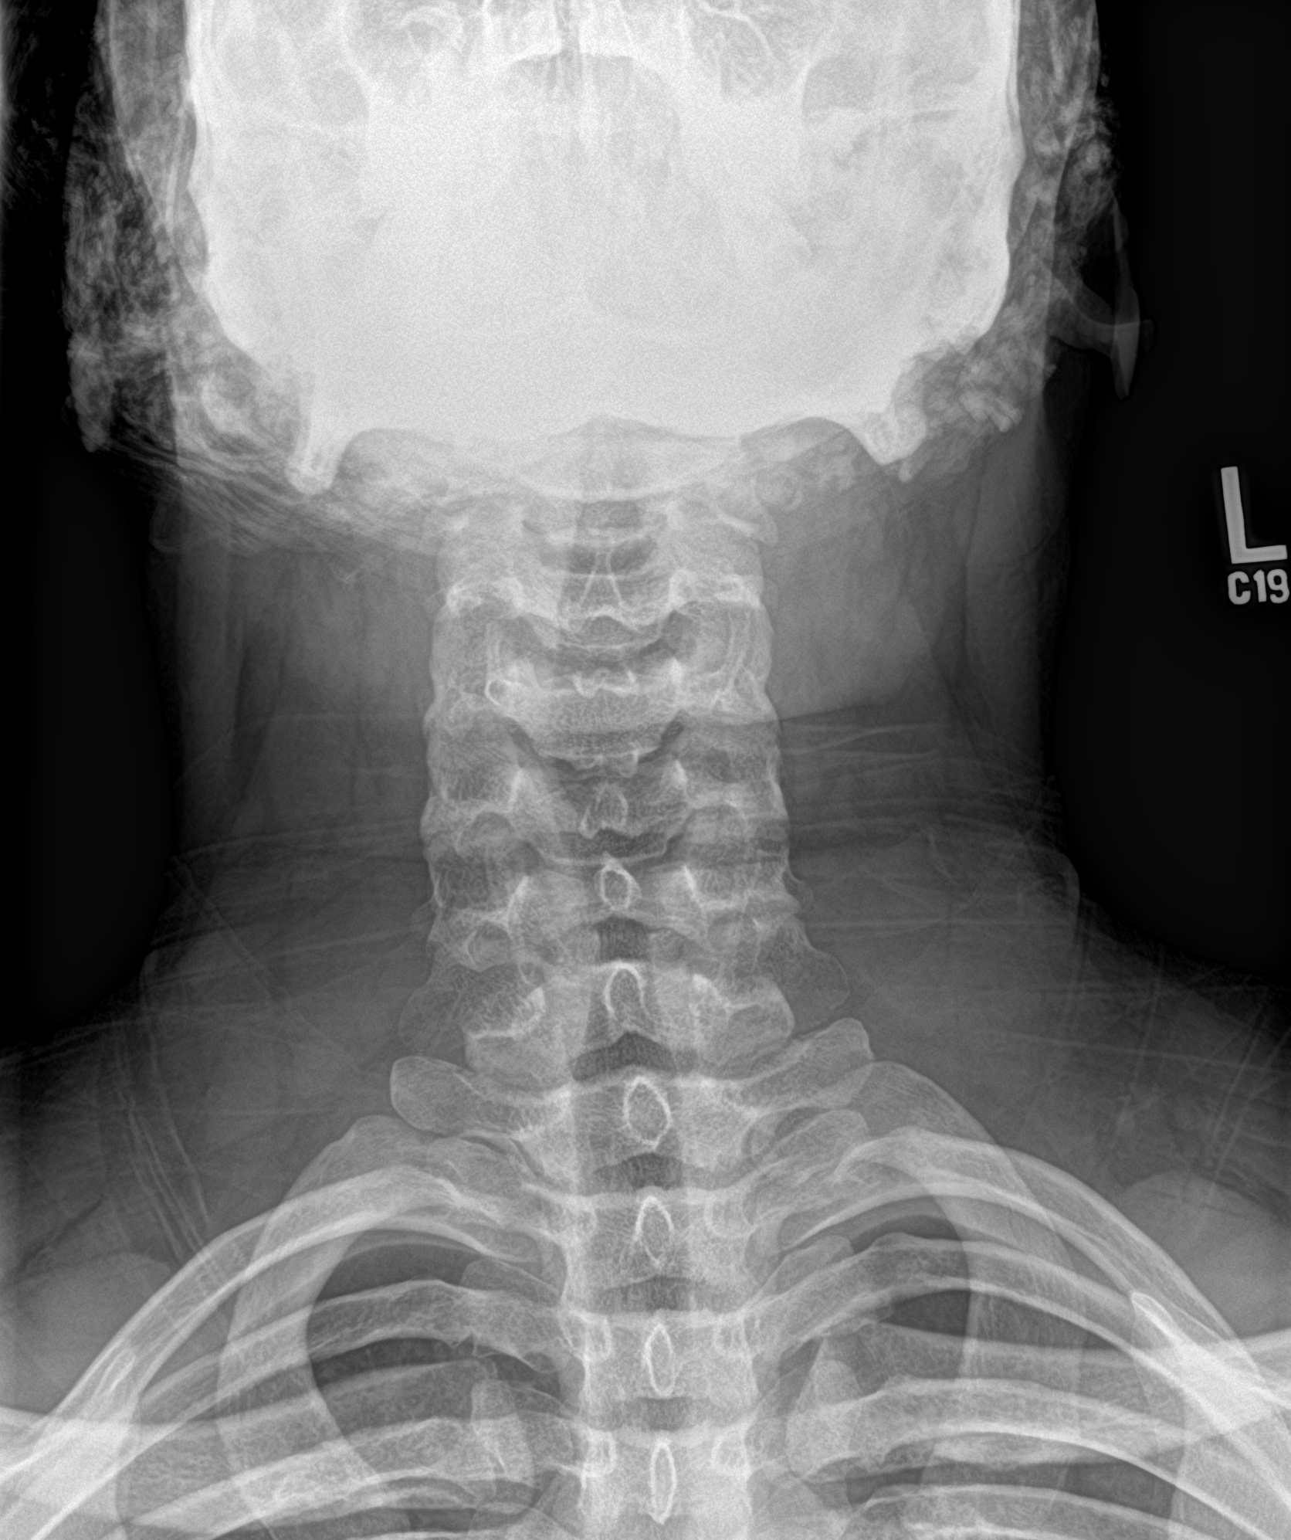

[2 of 2 positions shown; findings below may reference images not displayed]

FINDINGS: Frontal and lateral views were obtained. Epiglottis and
aryepiglottic folds appear normal. Prevertebral soft tissues are
normal. No air-fluid level to suggest abscess. Soft tissue
prominence is present in the lingular tonsil region. Tonsils and
adenoids elsewhere appear normal. No bony abnormality. Visualized
lungs clear. No tracheal narrowing noted.
IMPRESSION: Lingular tonsillar prominence of uncertain etiology. Inflammation
could cause this appearance. No other tonsillar enlargement.

Epiglottis and aryepiglottic folds appear normal. Study otherwise
unremarkable.
